# Patient Record
Sex: Female | Born: 2000 | Race: White | Hispanic: No | Marital: Single | Smoking: Never smoker
Health system: Southern US, Community
[De-identification: ages and names within clinical notes are randomized; demographics above are authoritative.]

---
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
## 2019-04-28 ENCOUNTER — Other Ambulatory Visit: Payer: Self-pay

## 2019-04-28 DIAGNOSIS — Z20822 Contact with and (suspected) exposure to covid-19: Secondary | ICD-10-CM

## 2019-05-01 LAB — NOVEL CORONAVIRUS, NAA: SARS-CoV-2, NAA: NOT DETECTED

## 2021-03-07 ENCOUNTER — Other Ambulatory Visit: Payer: Self-pay

## 2021-03-07 ENCOUNTER — Inpatient Hospital Stay
Admission: EM | Admit: 2021-03-07 | Discharge: 2021-03-08 | DRG: 916 | Discharge status: Against Medical Advice | Payer: BC Managed Care – PPO | Attending: Internal Medicine | Admitting: Internal Medicine

## 2021-03-07 ENCOUNTER — Encounter: Payer: Self-pay | Admitting: *Deleted

## 2021-03-07 DIAGNOSIS — J45909 Unspecified asthma, uncomplicated: Secondary | ICD-10-CM | POA: Diagnosis present

## 2021-03-07 DIAGNOSIS — K121 Other forms of stomatitis: Secondary | ICD-10-CM | POA: Diagnosis not present

## 2021-03-07 DIAGNOSIS — Z20822 Contact with and (suspected) exposure to covid-19: Secondary | ICD-10-CM | POA: Diagnosis present

## 2021-03-07 DIAGNOSIS — I959 Hypotension, unspecified: Secondary | ICD-10-CM | POA: Diagnosis present

## 2021-03-07 DIAGNOSIS — T7802XA Anaphylactic reaction due to shellfish (crustaceans), initial encounter: Secondary | ICD-10-CM | POA: Diagnosis not present

## 2021-03-07 DIAGNOSIS — E872 Acidosis, unspecified: Secondary | ICD-10-CM | POA: Diagnosis present

## 2021-03-07 DIAGNOSIS — Z5329 Procedure and treatment not carried out because of patient's decision for other reasons: Secondary | ICD-10-CM | POA: Diagnosis present

## 2021-03-07 DIAGNOSIS — T782XXA Anaphylactic shock, unspecified, initial encounter: Secondary | ICD-10-CM | POA: Diagnosis present

## 2021-03-07 DIAGNOSIS — Z6841 Body Mass Index (BMI) 40.0 and over, adult: Secondary | ICD-10-CM

## 2021-03-07 DIAGNOSIS — T7840XA Allergy, unspecified, initial encounter: Secondary | ICD-10-CM

## 2021-03-07 DIAGNOSIS — Z833 Family history of diabetes mellitus: Secondary | ICD-10-CM

## 2021-03-07 LAB — BASIC METABOLIC PANEL
Anion gap: 5 (ref 5–15)
BUN: 9 mg/dL (ref 6–20)
CO2: 23 mmol/L (ref 22–32)
Calcium: 9.1 mg/dL (ref 8.9–10.3)
Chloride: 109 mmol/L (ref 98–111)
Creatinine, Ser: 0.59 mg/dL (ref 0.44–1.00)
GFR, Estimated: >60 mL/min (ref >60)
Glucose, Bld: 111 mg/dL — ABNORMAL HIGH (ref 70–99)
Potassium: 3.6 mmol/L (ref 3.5–5.1)
Sodium: 137 mmol/L (ref 135–145)

## 2021-03-07 LAB — CBC WITH DIFFERENTIAL/PLATELET
Abs Immature Granulocytes: 0.02 10*3/uL (ref 0.00–0.07)
Basophils Absolute: 0.1 10*3/uL (ref 0.0–0.1)
Basophils Relative: 1 %
Eosinophils Absolute: 0.3 10*3/uL (ref 0.0–0.5)
Eosinophils Relative: 3 %
HCT: 36.8 % (ref 36.0–46.0)
Hemoglobin: 12.5 g/dL (ref 12.0–15.0)
Immature Granulocytes: 0 %
Lymphocytes Relative: 32 %
Lymphs Abs: 3.2 10*3/uL (ref 0.7–4.0)
MCH: 29.2 pg (ref 26.0–34.0)
MCHC: 34 g/dL (ref 30.0–36.0)
MCV: 86 fL (ref 80.0–100.0)
Monocytes Absolute: 0.6 10*3/uL (ref 0.1–1.0)
Monocytes Relative: 6 %
Neutro Abs: 5.8 10*3/uL (ref 1.7–7.7)
Neutrophils Relative %: 58 %
Platelets: 378 10*3/uL (ref 150–400)
RBC: 4.28 MIL/uL (ref 3.87–5.11)
RDW: 14.6 % (ref 11.5–15.5)
WBC: 10 10*3/uL (ref 4.0–10.5)
nRBC: 0 % (ref 0.0–0.2)

## 2021-03-07 MED ORDER — METHYLPREDNISOLONE SODIUM SUCC 125 MG IJ SOLR
125.0000 mg | Freq: Once | INTRAMUSCULAR | Status: AC
  Administered 2021-03-07: 125 mg via INTRAVENOUS
  Filled 2021-03-07: qty 2

## 2021-03-07 MED ORDER — EPINEPHRINE 0.3 MG/0.3ML IJ SOAJ: 0.3000 mg | INTRAMUSCULAR | 1 refills | Status: AC | PRN

## 2021-03-07 MED ORDER — PREDNISONE 20 MG PO TABS: 40.0000 mg | ORAL_TABLET | Freq: Every day | ORAL | 0 refills | Status: AC

## 2021-03-07 MED ORDER — DIPHENHYDRAMINE HCL 50 MG/ML IJ SOLN
50.0000 mg | Freq: Once | INTRAMUSCULAR | Status: AC
  Administered 2021-03-07: 50 mg via INTRAVENOUS
  Filled 2021-03-07: qty 1

## 2021-03-07 MED ORDER — EPINEPHRINE 0.3 MG/0.3ML IJ SOAJ
0.3000 mg | Freq: Once | INTRAMUSCULAR | Status: AC
  Administered 2021-03-07: 0.3 mg via INTRAMUSCULAR
  Filled 2021-03-07: qty 0.3

## 2021-03-07 MED ORDER — FAMOTIDINE IN NACL 20-0.9 MG/50ML-% IV SOLN
20.0000 mg | Freq: Once | INTRAVENOUS | Status: AC
  Administered 2021-03-07: 20 mg via INTRAVENOUS
  Filled 2021-03-07: qty 50

## 2021-03-07 NOTE — ED Notes (Signed)
Anaphylaxis, obs 147AM   Gilles Chiquito, MD 03/07/21 2320

## 2021-03-07 NOTE — ED Provider Notes (Signed)
[facility] Emergency Department Provider Note  ____________________________________________   I have reviewed the triage vital signs and the nursing notes.   HISTORY  Chief Complaint Allergic Reaction   History limited by: Not Limited   HPI Meredith Garcia is a 20 y.o. female who presents to the emergency department today because of concerns for possible allergic reaction.  Patient states that she ate a lobster tail and then roughly 15 minutes later started noticing some tingling in her mouth.  She also appreciated some swelling to her lower lip and felt like the right side of her mouth.  Patient denies any rash.  Denies any nausea or vomiting.  She denies any history of allergic reaction or reactions to shellfish. Denies any medication.  Records reviewed. Per medical record review patient has a history of asthma   Prior to Admission medications   Not on File    Allergies Patient has no known allergies.  No family history on file.  Social History Social History   Tobacco Use   Smoking status: Never   Smokeless tobacco: Never  Substance Use Topics   Alcohol use: Yes    Review of Systems Constitutional: No fever/chills Eyes: No visual changes. ENT: Positive for throat tingling, mouth and lip swelling. Cardiovascular: Denies chest pain. Respiratory: Denies shortness of breath. Gastrointestinal: No abdominal pain.  No nausea, no vomiting.  No diarrhea.   Genitourinary: Negative for dysuria. Musculoskeletal: Negative for back pain. Skin: Negative for rash. Neurological: Negative for headaches, focal weakness or numbness.  ____________________________________________   PHYSICAL EXAM:  VITAL SIGNS: ED Triage Vitals  Enc Vitals Group     BP 03/07/21 2122 134/77     Pulse Rate 03/07/21 2122 (!) 105     Resp 03/07/21 2122 20     Temp 03/07/21 2122 98.8 F (37.1 C)     Temp Source 03/07/21 2122 Oral     SpO2 03/07/21 2122 97 %     Weight  03/07/21 2122 274 lb (124.3 kg)     Height 03/07/21 2122 5\' 3"  (1.6 m)     Head Circumference --      Peak Flow --      Pain Score 03/07/21 2126 2   Constitutional: Alert and oriented.  Eyes: Conjunctivae are normal.  ENT      Head: Normocephalic and atraumatic.      Nose: No congestion/rhinnorhea.      Mouth/Throat: Swelling to lower lip. No tongue swelling appreciated.       Neck: No stridor. Hematological/Lymphatic/Immunilogical: No cervical lymphadenopathy. Cardiovascular: Normal rate, regular rhythm.  No murmurs, rubs, or gallops.  Respiratory: Normal respiratory effort without tachypnea nor retractions. Breath sounds are clear and equal bilaterally. No wheezes/rales/rhonchi. Gastrointestinal: Soft and non tender. No rebound. No guarding.  Genitourinary: Deferred Musculoskeletal: Normal range of motion in all extremities. No lower extremity edema. Neurologic:  Normal speech and language. No gross focal neurologic deficits are appreciated.  Skin:  Skin is warm, dry and intact. No rash noted. Psychiatric: Mood and affect are normal. Speech and behavior are normal. Patient exhibits appropriate insight and judgment.  ____________________________________________    LABS (pertinent positives/negatives)  CBC wbc 10.0, hgb 12.5, plt 378 BMP wnl except glu 111  ____________________________________________   EKG  None  ____________________________________________    RADIOLOGY  None  ____________________________________________   PROCEDURES  Procedures  ____________________________________________   INITIAL IMPRESSION / ASSESSMENT AND PLAN / ED COURSE  Pertinent labs & imaging results that were available during my  care of the patient were reviewed by me and considered in my medical decision making (see chart for details).   Patient presented to the emergency department today because of concerns for allergic reaction.  Patient did complain of some throat tingling  and did have some appreciable lower lip swelling.  At this time I do think allergic reaction likely.  Patient was given Benadryl Solu-Medrol and Pepcid.  I did discuss epinephrine with patient and did think it was reasonable to give in this situation given concern for possible throat involvement.  Additionally patient is young and I have low concern for any concerning side effects of the epinephrine.  Will plan on observing for 4 hours after epinephrine.  ____________________________________________   FINAL CLINICAL IMPRESSION(S) / ED DIAGNOSES  Final diagnoses:  Allergic reaction, initial encounter     Note: This dictation was prepared with Dragon dictation. Any transcriptional errors that result from this process are unintentional     Phineas Semen, MD 03/07/21 2316

## 2021-03-07 NOTE — ED Triage Notes (Signed)
Pt ate a lobster tail 15 minutes ago and now has a swollen lower lip and tingling in the mouth. No otc meds taken.  Pt alert.

## 2021-03-07 NOTE — Discharge Instructions (Addendum)
Please seek medical attention for any high fevers, chest pain, shortness of breath, change in behavior, persistent vomiting, bloody stool or any other new or concerning symptoms. [facility]

## 2021-03-08 DIAGNOSIS — Z6841 Body Mass Index (BMI) 40.0 and over, adult: Secondary | ICD-10-CM | POA: Diagnosis not present

## 2021-03-08 DIAGNOSIS — E872 Acidosis, unspecified: Secondary | ICD-10-CM | POA: Diagnosis present

## 2021-03-08 DIAGNOSIS — T7840XA Allergy, unspecified, initial encounter: Secondary | ICD-10-CM | POA: Diagnosis not present

## 2021-03-08 DIAGNOSIS — T7802XA Anaphylactic reaction due to shellfish (crustaceans), initial encounter: Secondary | ICD-10-CM | POA: Diagnosis present

## 2021-03-08 DIAGNOSIS — I959 Hypotension, unspecified: Secondary | ICD-10-CM | POA: Diagnosis present

## 2021-03-08 DIAGNOSIS — T782XXA Anaphylactic shock, unspecified, initial encounter: Secondary | ICD-10-CM

## 2021-03-08 DIAGNOSIS — J45909 Unspecified asthma, uncomplicated: Secondary | ICD-10-CM | POA: Diagnosis present

## 2021-03-08 DIAGNOSIS — K121 Other forms of stomatitis: Secondary | ICD-10-CM | POA: Diagnosis present

## 2021-03-08 DIAGNOSIS — R220 Localized swelling, mass and lump, head: Secondary | ICD-10-CM

## 2021-03-08 DIAGNOSIS — Z20822 Contact with and (suspected) exposure to covid-19: Secondary | ICD-10-CM | POA: Diagnosis present

## 2021-03-08 DIAGNOSIS — Z5329 Procedure and treatment not carried out because of patient's decision for other reasons: Secondary | ICD-10-CM | POA: Diagnosis present

## 2021-03-08 DIAGNOSIS — Z833 Family history of diabetes mellitus: Secondary | ICD-10-CM | POA: Diagnosis not present

## 2021-03-08 LAB — CBC
HCT: 36.7 % (ref 36.0–46.0)
Hemoglobin: 12 g/dL (ref 12.0–15.0)
MCH: 28.4 pg (ref 26.0–34.0)
MCHC: 32.7 g/dL (ref 30.0–36.0)
MCV: 87 fL (ref 80.0–100.0)
Platelets: 346 10*3/uL (ref 150–400)
RBC: 4.22 MIL/uL (ref 3.87–5.11)
RDW: 14.7 % (ref 11.5–15.5)
WBC: 8.9 10*3/uL (ref 4.0–10.5)
nRBC: 0 % (ref 0.0–0.2)

## 2021-03-08 LAB — BASIC METABOLIC PANEL
Anion gap: 9 (ref 5–15)
BUN: 9 mg/dL (ref 6–20)
CO2: 19 mmol/L — ABNORMAL LOW (ref 22–32)
Calcium: 8.8 mg/dL — ABNORMAL LOW (ref 8.9–10.3)
Chloride: 108 mmol/L (ref 98–111)
Creatinine, Ser: 0.69 mg/dL (ref 0.44–1.00)
GFR, Estimated: >60 mL/min (ref >60)
Glucose, Bld: 197 mg/dL — ABNORMAL HIGH (ref 70–99)
Potassium: 3.9 mmol/L (ref 3.5–5.1)
Sodium: 136 mmol/L (ref 135–145)

## 2021-03-08 LAB — RESP PANEL BY RT-PCR (FLU A&B, COVID) ARPGX2
Influenza A by PCR: NEGATIVE
Influenza B by PCR: NEGATIVE
SARS Coronavirus 2 by RT PCR: NEGATIVE

## 2021-03-08 LAB — HIV ANTIBODY (ROUTINE TESTING W REFLEX): HIV Screen 4th Generation wRfx: NONREACTIVE

## 2021-03-08 MED ORDER — ENOXAPARIN SODIUM 60 MG/0.6ML IJ SOSY
0.5000 mg/kg | PREFILLED_SYRINGE | INTRAMUSCULAR | Status: DC
  Administered 2021-03-08: 62.5 mg via SUBCUTANEOUS

## 2021-03-08 MED ORDER — METHYLPREDNISOLONE SODIUM SUCC 125 MG IJ SOLR
60.0000 mg | Freq: Three times a day (TID) | INTRAMUSCULAR | Status: DC
  Administered 2021-03-08: 60 mg via INTRAVENOUS
  Filled 2021-03-08: qty 2

## 2021-03-08 MED ORDER — ACETAMINOPHEN 325 MG RE SUPP: 650.0000 mg | Freq: Four times a day (QID) | RECTAL | Status: DC | PRN

## 2021-03-08 MED ORDER — ALBUTEROL SULFATE HFA 108 (90 BASE) MCG/ACT IN AERS: 2.0000 | INHALATION_SPRAY | Freq: Four times a day (QID) | RESPIRATORY_TRACT | Status: DC | PRN

## 2021-03-08 MED ORDER — SODIUM CHLORIDE 0.9 % IV SOLN
25.0000 mg | Freq: Four times a day (QID) | INTRAVENOUS | Status: DC
  Administered 2021-03-08: 25 mg via INTRAVENOUS
  Filled 2021-03-08 (×4): qty 0.5

## 2021-03-08 MED ORDER — FAMOTIDINE IN NACL 20-0.9 MG/50ML-% IV SOLN
20.0000 mg | Freq: Two times a day (BID) | INTRAVENOUS | Status: DC
  Administered 2021-03-08: 20 mg via INTRAVENOUS
  Filled 2021-03-08: qty 50

## 2021-03-08 MED ORDER — ACETAMINOPHEN 325 MG PO TABS: 650.0000 mg | ORAL_TABLET | Freq: Four times a day (QID) | ORAL | Status: DC | PRN

## 2021-03-08 MED ORDER — EPINEPHRINE 0.3 MG/0.3ML IJ SOAJ
0.3000 mg | Freq: Once | INTRAMUSCULAR | Status: AC
  Administered 2021-03-08: 0.3 mg via INTRAMUSCULAR
  Filled 2021-03-08: qty 0.3

## 2021-03-08 MED ORDER — LACTATED RINGERS IV BOLUS
1000.0000 mL | Freq: Once | INTRAVENOUS | Status: AC
  Administered 2021-03-08: 1000 mL via INTRAVENOUS

## 2021-03-08 MED ORDER — ONDANSETRON HCL 4 MG/2ML IJ SOLN: 4.0000 mg | Freq: Four times a day (QID) | INTRAMUSCULAR | Status: DC | PRN

## 2021-03-08 MED ORDER — POTASSIUM CHLORIDE IN NACL 20-0.9 MEQ/L-% IV SOLN
INTRAVENOUS | Status: DC
  Filled 2021-03-08: qty 1000

## 2021-03-08 MED ORDER — ONDANSETRON HCL 4 MG PO TABS: 4.0000 mg | ORAL_TABLET | Freq: Four times a day (QID) | ORAL | Status: DC | PRN

## 2021-03-08 MED ORDER — MAGNESIUM HYDROXIDE 400 MG/5ML PO SUSP: 30.0000 mL | Freq: Every day | ORAL | Status: DC | PRN

## 2021-03-08 MED ORDER — TRAZODONE HCL 50 MG PO TABS: 25.0000 mg | ORAL_TABLET | Freq: Every evening | ORAL | Status: DC | PRN

## 2021-03-08 MED ORDER — ALBUTEROL SULFATE (2.5 MG/3ML) 0.083% IN NEBU: 2.5000 mg | INHALATION_SOLUTION | Freq: Four times a day (QID) | RESPIRATORY_TRACT | Status: DC | PRN

## 2021-03-08 NOTE — ED Notes (Signed)
Pt requesting discharge instead of awaiting assignment of inpatient bed, symptoms improved, VS stable.  Admitting MD made aware and will speak to pt and family, recommends full 24 hours of observation.

## 2021-03-08 NOTE — ED Notes (Signed)
RN to bedside to introduce self to pt. Unhooked pt from monitor so she could use the restroom. Then hooked back up. Pt ambulatory on her own.

## 2021-03-08 NOTE — ED Notes (Signed)
Hospitalist in to admit 

## 2021-03-08 NOTE — Progress Notes (Signed)
PHARMACIST - PHYSICIAN COMMUNICATION  CONCERNING:  Enoxaparin (Lovenox) for DVT Prophylaxis    RECOMMENDATION: Patient was prescribed enoxaprin 40mg  q24 hours for VTE prophylaxis.   Filed Weights   03/07/21 2122 03/07/21 2126  Weight: 124.3 kg (274 lb) 124.3 kg (274 lb)    Body mass index is 48.54 kg/m.  Estimated Creatinine Clearance: 141.4 mL/min (by C-G formula based on SCr of 0.59 mg/dL).   Based on [facility] policy patient is candidate for enoxaparin 0.5mg /kg TBW SQ every 24 hours based on BMI being >30.   DESCRIPTION: Pharmacy has adjusted enoxaparin dose per [facility] policy.  Patient is now receiving enoxaparin 0.5 mg/kg every 24 hours   [facility], PharmD, [facility] 03/08/2021 4:56 AM

## 2021-03-08 NOTE — Discharge Summary (Signed)
Physician Discharge Summary  Marga Gramajo UYQ:[id number] DOB: 08/21/2000 DOA: 03/07/2021  PCP: Pcp, No  Admit date: 03/07/2021 Discharge date: 03/08/2021  Admitted From: Home Disposition: Left AMA  Recommendations for Outpatient Follow-up:  Follow up with PCP in 1-2 weeks Please obtain CMP/CBC, Mag, Phos in one week Please follow up on the following pending results:  Home Health: No  Equipment/Devices: None    Discharge Condition: Guarded  CODE STATUS: FULL CODE  Diet recommendation: Heart Healthy Diet   Brief/Interim Summary: HPI per Dr. Valente David on 03/08/21 Meredith Garcia is a 20 y.o. female with medical history significant for asthma, who presented to the emergency room after eating a lobster tail with subsequent tingling in her lips and oral numbness as well as "feeling funny" she then experienced throat tingling and noticed significant lower lip swelling.  She denies any cough or wheezing or dyspnea.  No rashes.  No chest pain or palpitations.  She denies any nausea or vomiting or abdominal pain or diarrhea.  No fever chills.  She denies taking any new medications or supplements or using any new lotions or soaps or detergents.   ED Course: Upon presentation to the ER, heart rate was 105 with otherwise normal vital signs.  Blood pressure later on was down to 90/49 with a MAP of 63 then 61 and later 100/50 with a MAP of 65.  Labs revealed unremarkable BMP and CBC.  Influenza antigens and COVID-19 PCR came back negative.   Imaging: None.  The patient was given IM epinephrine 0.3 mg twice, 50 mg of IV Benadryl and 20 mg IV Pepcid as well as 1 L bolus of IV lactated Ringer and 125 mg of IV Solu-Medrol.  She will be admitted to a progressive unit bed for further evaluation and management.  Interim History Patient's lip swelling improved but was not entirely gone.  I spoke to her at length this morning about staying for medical treatment and she initially wanted to sign out this early  morning but I recommended that she stay for this 24 hours observation to evaluate improvement on the H1, H2 and scheduled steroids.  She is agreeable but then later this morning she signed out AGAINST MEDICAL ADVICE being of sound mind and understand the risks of further decompensated, worsening of her condition and even possible death and she accepted these risks and liability on her own accord being of sound mind.  Discharge Diagnoses:  Active Problems:   Anaphylaxis  Anaphylactic allergic reaction like secondary to seafood. - The patient was admitted to a medical monitored bed  - We will continue aggressive hydration with IV normal saline especially given her hypotension. - She was given 2 doses of IM epinephrine and will keep it on a as needed basis. - We will continue steroid therapy with IV Solu-Medrol and started on Solumedrol 125 mg x1 and then 60 mg q8h - We will continue H1 and H2 blocker therapy with IV Benadryl and Pepcid. - We will monitor her lip swelling and it was improving but not gone -Patient decided to leave AGAINST MEDICAL ADVICE as she longer wanted to be hospitalized.  I explained her about the risks of leaving this morning and she understood and accepted his risks on her own accord as she was of sound mind  Asthma, currently controlled. - She will be continued on her as needed albuterol. - She has no respiratory compromise at this time.  Metabolic acidosis -Mild with a CO2 of 19, anion gap of  9, chloride level 108 -We will continue IV fluid hydration but patient then signed out AGAINST MEDICAL ADVICE  Morbid Obesity -Complicates overall prognosis and care -Estimated body mass index is 48.54 kg/m as calculated from the following:   Height as of this encounter: 5\' 3"  (1.6 m).   Weight as of this encounter: 124.3 kg. -Weight Loss and Dietary Counseling given   Discharge Instructions  Allergies as of 03/08/2021   No Known Allergies      Medication List      TAKE these medications    albuterol 108 (90 Base) MCG/ACT inhaler Commonly known as: VENTOLIN HFA Inhale 2 puffs into the lungs every 6 (six) hours as needed for shortness of breath or wheezing.   EPINEPHrine 0.3 mg/0.3 mL Soaj injection Commonly known as: EPI-PEN Inject 0.3 mg into the muscle as needed for anaphylaxis.   predniSONE 20 MG tablet Commonly known as: DELTASONE Take 2 tablets (40 mg total) by mouth daily with breakfast for 3 days.        Follow-up Information     [facility] In 2 days.   Specialty: Primary Care Contact information: [address] [place] [phone]        Center, [facility] In 2 days.   Specialty: General Practice Contact information: [address] [place] [phone]         Center, [phone] [facility] In 2 days.   Specialty: General Practice Contact information: [address] [place] [phone]         [facility]   Contact information: [address] [place] [phone]                No Known Allergies  Consultations: None  Procedures/Studies: No results found.   Subjective: Seen and examined at bedside this morning and her lip was still very swollen but had improved from her initial picture that she showed me.  Wanted to observe for another 24 hours and steroids, H1 and H2 blocker and explained to her that anaphylaxis was a serious reaction and it would be in her best interest to stay for further treatment however she refused and signed out AGAINST MEDICAL ADVICE later this afternoon being of sound mind.  Discharge Exam: Vitals:   03/08/21 1030 03/08/21 1200  BP: 133/65 (!) 131/57  Pulse: (!) 101 95  Resp: 18 (!) 21  Temp:  98.5 F (36.9 C)  SpO2: 100% 99%   Vitals:   03/08/21 0730 03/08/21 0830 03/08/21 1030 03/08/21 1200  BP: 119/66  125/65 133/65 (!) 131/57  Pulse: 80 63 (!) 101 95  Resp: 19 (!) 21 18 (!) 21  Temp:    98.5 F (36.9 C)  TempSrc:    Oral  SpO2: 99% 98% 100% 99%  Weight:      Height:       General: Pt is alert, awake, not in acute distress; lip is swollen still Cardiovascular: RRR, S1/S2 +, no rubs, no gallops Respiratory: Diminished bilaterally, no wheezing, no rhonchi Abdominal: Soft, NT, distended secondary body habitus, bowel sounds + Extremities: no edema, no cyanosis  The results of significant diagnostics from this hospitalization (including imaging, microbiology, ancillary and laboratory) are listed below for reference.    Microbiology: Recent Results (from the past 240 hour(s))  Resp Panel by RT-PCR (Flu A&B, Covid) Nasopharyngeal Swab     Status: None   Collection  Time: 03/08/21  3:29 AM   Specimen: Nasopharyngeal Swab; Nasopharyngeal(NP) swabs in vial transport medium  Result Value Ref Range Status   SARS Coronavirus 2 by RT PCR NEGATIVE NEGATIVE Final    Comment: (NOTE) SARS-CoV-2 target nucleic acids are NOT DETECTED.  The SARS-CoV-2 RNA is generally detectable in upper respiratory specimens during the acute phase of infection. The lowest concentration of SARS-CoV-2 viral copies this assay can detect is 138 copies/mL. A negative result does not preclude SARS-Cov-2 infection and should not be used as the sole basis for treatment or other patient management decisions. A negative result may occur with  improper specimen collection/handling, submission of specimen other than nasopharyngeal swab, presence of viral mutation(s) within the areas targeted by this assay, and inadequate number of viral copies(<138 copies/mL). A negative result must be combined with clinical observations, patient history, and epidemiological information. The expected result is Negative.  Fact Sheet for Patients:  BloggerCourse.com  Fact Sheet for Healthcare Providers:   SeriousBroker.it  This test is no t yet approved or cleared by the [place] FDA and  has been authorized for detection and/or diagnosis of SARS-CoV-2 by FDA under an Emergency Use Authorization (EUA). This EUA will remain  in effect (meaning this test can be used) for the duration of the COVID-19 declaration under Section 564(b)(1) of the Act, 21 U.S.C.section 360bbb-3(b)(1), unless the authorization is terminated  or revoked sooner.       Influenza A by PCR NEGATIVE NEGATIVE Final   Influenza B by PCR NEGATIVE NEGATIVE Final    Comment: (NOTE) The Xpert Xpress SARS-CoV-2/FLU/RSV plus assay is intended as an aid in the diagnosis of influenza from Nasopharyngeal swab specimens and should not be used as a sole basis for treatment. Nasal washings and aspirates are unacceptable for Xpert Xpress SARS-CoV-2/FLU/RSV testing.  Fact Sheet for Patients: BloggerCourse.com  Fact Sheet for Healthcare Providers: SeriousBroker.it  This test is not yet approved or cleared by the [place] FDA and has been authorized for detection and/or diagnosis of SARS-CoV-2 by FDA under an Emergency Use Authorization (EUA). This EUA will remain in effect (meaning this test can be used) for the duration of the COVID-19 declaration under Section 564(b)(1) of the Act, 21 U.S.C. section 360bbb-3(b)(1), unless the authorization is terminated or revoked.  Performed at [facility], [address] [place], [place] [address]      Labs: BNP (last 3 results) No results for input(s): BNP in the last 8760 hours. Basic Metabolic Panel: Recent Labs  Lab 03/07/21 2208 03/08/21 0531  NA 137 136  K 3.6 3.9  CL 109 108  CO2 23 19*  GLUCOSE 111* 197*  BUN 9 9  CREATININE 0.59 0.69  CALCIUM 9.1 8.8*   Liver Function Tests: No results for input(s): AST, ALT, ALKPHOS, BILITOT, PROT, ALBUMIN in the last 168  hours. No results for input(s): LIPASE, AMYLASE in the last 168 hours. No results for input(s): AMMONIA in the last 168 hours. CBC: Recent Labs  Lab 03/07/21 2208 03/08/21 0531  WBC 10.0 8.9  NEUTROABS 5.8  --   HGB 12.5 12.0  HCT 36.8 36.7  MCV 86.0 87.0  PLT 378 346   Cardiac Enzymes: No results for input(s): CKTOTAL, CKMB, CKMBINDEX, TROPONINI in the last 168 hours. BNP: Invalid input(s): POCBNP CBG: No results for input(s): GLUCAP in the last 168 hours. D-Dimer No results for input(s): DDIMER in the last 72 hours. Hgb A1c No results for input(s): HGBA1C in the last 72 hours.  Lipid Profile No results for input(s): CHOL, HDL, LDLCALC, TRIG, CHOLHDL, LDLDIRECT in the last 72 hours. Thyroid function studies No results for input(s): TSH, T4TOTAL, T3FREE, THYROIDAB in the last 72 hours.  Invalid input(s): FREET3 Anemia work up No results for input(s): VITAMINB12, FOLATE, FERRITIN, TIBC, IRON, RETICCTPCT in the last 72 hours. Urinalysis No results found for: COLORURINE, APPEARANCEUR, LABSPEC, PHURINE, GLUCOSEU, HGBUR, BILIRUBINUR, KETONESUR, PROTEINUR, UROBILINOGEN, NITRITE, LEUKOCYTESUR Sepsis Labs Invalid input(s): PROCALCITONIN,  WBC,  LACTICIDVEN Microbiology Recent Results (from the past 240 hour(s))  Resp Panel by RT-PCR (Flu A&B, Covid) Nasopharyngeal Swab     Status: None   Collection Time: 03/08/21  3:29 AM   Specimen: Nasopharyngeal Swab; Nasopharyngeal(NP) swabs in vial transport medium  Result Value Ref Range Status   SARS Coronavirus 2 by RT PCR NEGATIVE NEGATIVE Final    Comment: (NOTE) SARS-CoV-2 target nucleic acids are NOT DETECTED.  The SARS-CoV-2 RNA is generally detectable in upper respiratory specimens during the acute phase of infection. The lowest concentration of SARS-CoV-2 viral copies this assay can detect is 138 copies/mL. A negative result does not preclude SARS-Cov-2 infection and should not be used as the sole basis for treatment or other  patient management decisions. A negative result may occur with  improper specimen collection/handling, submission of specimen other than nasopharyngeal swab, presence of viral mutation(s) within the areas targeted by this assay, and inadequate number of viral copies(<138 copies/mL). A negative result must be combined with clinical observations, patient history, and epidemiological information. The expected result is Negative.  Fact Sheet for Patients:  BloggerCourse.com  Fact Sheet for Healthcare Providers:  SeriousBroker.it  This test is no t yet approved or cleared by the [place] FDA and  has been authorized for detection and/or diagnosis of SARS-CoV-2 by FDA under an Emergency Use Authorization (EUA). This EUA will remain  in effect (meaning this test can be used) for the duration of the COVID-19 declaration under Section 564(b)(1) of the Act, 21 U.S.C.section 360bbb-3(b)(1), unless the authorization is terminated  or revoked sooner.       Influenza A by PCR NEGATIVE NEGATIVE Final   Influenza B by PCR NEGATIVE NEGATIVE Final    Comment: (NOTE) The Xpert Xpress SARS-CoV-2/FLU/RSV plus assay is intended as an aid in the diagnosis of influenza from Nasopharyngeal swab specimens and should not be used as a sole basis for treatment. Nasal washings and aspirates are unacceptable for Xpert Xpress SARS-CoV-2/FLU/RSV testing.  Fact Sheet for Patients: BloggerCourse.com  Fact Sheet for Healthcare Providers: SeriousBroker.it  This test is not yet approved or cleared by the [place] FDA and has been authorized for detection and/or diagnosis of SARS-CoV-2 by FDA under an Emergency Use Authorization (EUA). This EUA will remain in effect (meaning this test can be used) for the duration of the COVID-19 declaration under Section 564(b)(1) of the Act, 21 U.S.C. section  360bbb-3(b)(1), unless the authorization is terminated or revoked.  Performed at [facility], [address], [place], [place] [address]    Time coordinating discharge: 35 minutes  SIGNED:  Merlene Laughter, DO [facility] 03/08/2021, 12:51 PM Pager is on AMION  If 7PM-7AM, please contact night-coverage [email or web address]

## 2021-03-08 NOTE — ED Notes (Signed)
Pt is requesting to leave. MD messaged. MD refuses to DC pt and advised she can leave AMA but he will not DC her today.

## 2021-03-08 NOTE — H&P (Signed)
[place]   PATIENT NAME: Meredith Garcia    MR#:  [id number]  DATE OF BIRTH:  09-02-2000  DATE OF ADMISSION:  03/07/2021  PRIMARY CARE PHYSICIAN: Pcp, No   Patient is coming from: Home  REQUESTING/REFERRING PHYSICIAN: Antoine Primas, MD  CHIEF COMPLAINT:   Chief Complaint  Patient presents with   Allergic Reaction    HISTORY OF PRESENT ILLNESS:  Meredith Garcia is a 20 y.o. female with medical history significant for asthma, who presented to the emergency room after eating a lobster tail with subsequent tingling in her lips and oral numbness as well as "feeling funny" she then experienced throat tingling and noticed significant lower lip swelling.  She denies any cough or wheezing or dyspnea.  No rashes.  No chest pain or palpitations.  She denies any nausea or vomiting or abdominal pain or diarrhea.  No fever chills.  She denies taking any new medications or supplements or using any new lotions or soaps or detergents.  ED Course: Upon presentation to the ER, heart rate was 105 with otherwise normal vital signs.  Blood pressure later on was down to 90/49 with a MAP of 63 then 61 and later 100/50 with a MAP of 65.  Labs revealed unremarkable BMP and CBC.  Influenza antigens and COVID-19 PCR came back negative.  Imaging: None.  The patient was given IM epinephrine 0.3 mg twice, 50 mg of IV Benadryl and 20 mg IV Pepcid as well as 1 L bolus of IV lactated Ringer and 125 mg of IV Solu-Medrol.  She will be admitted to a progressive unit bed for further evaluation and management. PAST MEDICAL HISTORY:  Asthma.  PAST SURGICAL HISTORY:  She denied any previous surgeries. SOCIAL HISTORY:   Social History   Tobacco Use   Smoking status: Never   Smokeless tobacco: Never  Substance Use Topics   Alcohol use: Yes  No history of tobacco EtOH abuse or illicit drug use.  FAMILY HISTORY:    Positive for diabetes mellitus in her mother.  DRUG ALLERGIES:  No Known Allergies  REVIEW  OF SYSTEMS:   ROS As per history of present illness. All pertinent systems were reviewed above. Constitutional, HEENT, cardiovascular, respiratory, GI, GU, musculoskeletal, neuro, psychiatric, endocrine, integumentary and hematologic systems were reviewed and are otherwise negative/unremarkable except for positive findings mentioned above in the HPI.   MEDICATIONS AT HOME:   Prior to Admission medications   Medication Sig Start Date End Date Taking? Authorizing Provider  albuterol (VENTOLIN HFA) 108 (90 Base) MCG/ACT inhaler Inhale 2 puffs into the lungs every 6 (six) hours as needed for shortness of breath or wheezing.   Yes [provider]  EPINEPHrine 0.3 mg/0.3 mL IJ SOAJ injection Inject 0.3 mg into the muscle as needed for anaphylaxis. 03/07/21  Yes Phineas Semen, MD  predniSONE (DELTASONE) 20 MG tablet Take 2 tablets (40 mg total) by mouth daily with breakfast for 3 days. 03/07/21 03/10/21 Yes Phineas Semen, MD      VITAL SIGNS:  Blood pressure (!) 104/59, pulse 74, temperature 98.8 F (37.1 C), temperature source Oral, resp. rate (!) 22, height 5\' 3"  (1.6 m), weight 124.3 kg, last menstrual period 02/06/2021, SpO2 98 %.  PHYSICAL EXAMINATION:  Physical Exam  GENERAL:  20 y.o.-year-old African-American patient lying in the bed with no acute distress.  EYES: Pupils equal, round, reactive to light and accommodation. No scleral icterus. Extraocular muscles intact.  HEENT: Head atraumatic, normocephalic. Oropharynx: Significantly swollen lower  lip and nasopharynx clear.  No tongue swelling.  Moist mucous membranes and tongue. NECK:  Supple, no jugular venous distention. No thyroid enlargement, no tenderness.  LUNGS: Normal breath sounds bilaterally, no wheezing, rales,rhonchi or crepitation. No use of accessory muscles of respiration.  CARDIOVASCULAR: Regular rate and rhythm, S1, S2 normal. No murmurs, rubs, or gallops.  ABDOMEN: Soft, nondistended, nontender. Bowel  sounds present. No organomegaly or mass.  EXTREMITIES: No pedal edema, cyanosis, or clubbing.  NEUROLOGIC: Cranial nerves II through XII are intact. Muscle strength 5/5 in all extremities. Sensation intact. Gait not checked.  PSYCHIATRIC: The patient is alert and oriented x 3.  Normal affect and good eye contact. SKIN: No obvious rash, lesion, or ulcer.   LABORATORY PANEL:   CBC Recent Labs  Lab 03/08/21 0531  WBC 8.9  HGB 12.0  HCT 36.7  PLT 346   ------------------------------------------------------------------------------------------------------------------  Chemistries  Recent Labs  Lab 03/07/21 2208  NA 137  K 3.6  CL 109  CO2 23  GLUCOSE 111*  BUN 9  CREATININE 0.59  CALCIUM 9.1   ------------------------------------------------------------------------------------------------------------------  Cardiac Enzymes No results for input(s): TROPONINI in the last 168 hours. ------------------------------------------------------------------------------------------------------------------  RADIOLOGY:  No results found.    IMPRESSION AND PLAN:  Active Problems:   Anaphylaxis 1.  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 - The patient will be admitted to a medical monitored bed for - We will continue aggressive hydration with IV normal saline especially given her hypotension. - She was given 2 doses of IM epinephrine and will keep it on a as needed basis. - We will continue steroid therapy with IV Solu-Medrol. - We will continue H1 and H2 blocker therapy with IV Benadryl and Pepcid. - We will monitor her lip swelling.  2.  Asthma, currently controlled. - She will be continued on her as needed albuterol. - She has no respiratory compromise at this time.  DVT prophylaxis: Lovenox. Code Status: full code. Family Communication:  The plan of care was discussed in details with the patient (and family). I answered all questions. The patient agreed to  proceed with the above mentioned plan. Further management will depend upon hospital course. Disposition Plan: Back to previous home environment Consults called: none. All the records are reviewed and case discussed with ED provider.  Status is: Inpatient  Remains inpatient appropriate because:Ongoing diagnostic testing needed not appropriate for outpatient work up, Unsafe d/c plan, IV treatments appropriate due to intensity of illness or inability to take PO, and Inpatient level of care appropriate due to severity of illness   Dispo: The patient is from: Home              Anticipated d/c is to: Home              Patient currently is not medically stable to d/c.              Difficult to place patient: No  TOTAL TIME TAKING CARE OF THIS PATIENT: 50 minutes.     Hannah Beat M.D on 03/08/2021 at 5:45 AM  [facility]   From 7 PM-7 AM, contact night-coverage [email or web address]  CC: Primary care physician; Pcp, No

## 2021-03-08 NOTE — ED Provider Notes (Signed)
I assumed care of the patient approximate 2300.  Please see (note for full details regarding patient's initial evaluation assessment.  In brief patient presents with concern for possible anaphylactic reaction to some seafood she had earlier prior to arrival.  Primary symptoms include some swelling in the lower lip and right mouth and some tingling.  She was treated for possible anaphylaxis with epi, steroids, Benadryl, famotidine and fluids.  Plan is to observe until 145 in p.m. and if she does not require repeat dosing of medications likely discharge with outpatient follow-up.  On several reassessments patient is denying any new symptoms although is noted to have persistently low blood pressures with maps dipping between 60 and 65.  She was given additional 500 cc of fluid and on reassessment her BP is 101/50.  Will redose epi given additional 500 cc of fluid admit to hospitalist service for further evaluation and management of anaphylaxis requiring repeat administration of epinephrine.  .Critical Care Performed by: Gilles Chiquito, MD Authorized by: Gilles Chiquito, MD   Critical care provider statement:    Critical care time (minutes):  45   Critical care was necessary to treat or prevent imminent or life-threatening deterioration of the following conditions: anaphylaxis requiring repeat doses of epi.   Critical care was time spent personally by me on the following activities:  Pulse oximetry, review of old charts, examination of patient, evaluation of patient's response to treatment and development of treatment plan with patient or surrogate   I assumed direction of critical care for this patient from another provider in my specialty: yes     Care discussed with: admitting provider      Gilles Chiquito, MD 03/08/21 [phone]
